# Patient Record
Sex: Female | Born: 1970 | Race: White | Hispanic: No | Marital: Married | Smoking: Never smoker
Health system: Southern US, Community
[De-identification: ages and names within clinical notes are randomized; demographics above are authoritative.]

## PROBLEM LIST (undated history)

## (undated) DIAGNOSIS — F909 Attention-deficit hyperactivity disorder, unspecified type: Secondary | ICD-10-CM

## (undated) DIAGNOSIS — F32A Depression, unspecified: Secondary | ICD-10-CM

## (undated) DIAGNOSIS — F419 Anxiety disorder, unspecified: Secondary | ICD-10-CM

---

## 2011-01-05 ENCOUNTER — Emergency Department (HOSPITAL_COMMUNITY)
Admission: EM | Admit: 2011-01-05 | Discharge: 2011-01-05 | Disposition: A | Discharge status: Home / Self Care | Payer: PRIVATE HEALTH INSURANCE | Attending: Emergency Medicine | Admitting: Emergency Medicine

## 2011-01-05 DIAGNOSIS — Y92009 Unspecified place in unspecified non-institutional (private) residence as the place of occurrence of the external cause: Secondary | ICD-10-CM | POA: Insufficient documentation

## 2011-01-05 DIAGNOSIS — S61209A Unspecified open wound of unspecified finger without damage to nail, initial encounter: Secondary | ICD-10-CM | POA: Insufficient documentation

## 2011-01-05 DIAGNOSIS — Y93G1 Activity, food preparation and clean up: Secondary | ICD-10-CM | POA: Insufficient documentation

## 2011-01-05 DIAGNOSIS — S6990XA Unspecified injury of unspecified wrist, hand and finger(s), initial encounter: Secondary | ICD-10-CM | POA: Insufficient documentation

## 2011-01-05 DIAGNOSIS — M79609 Pain in unspecified limb: Secondary | ICD-10-CM | POA: Insufficient documentation

## 2011-01-05 DIAGNOSIS — S6980XA Other specified injuries of unspecified wrist, hand and finger(s), initial encounter: Secondary | ICD-10-CM | POA: Insufficient documentation

## 2011-01-05 DIAGNOSIS — W260XXA Contact with knife, initial encounter: Secondary | ICD-10-CM | POA: Insufficient documentation

## 2013-05-11 ENCOUNTER — Encounter (HOSPITAL_COMMUNITY): Payer: Self-pay | Admitting: Cardiology

## 2013-05-11 ENCOUNTER — Emergency Department (HOSPITAL_COMMUNITY)
Admission: EM | Admit: 2013-05-11 | Discharge: 2013-05-11 | Disposition: A | Discharge status: Home / Self Care | Payer: PRIVATE HEALTH INSURANCE | Attending: Emergency Medicine | Admitting: Emergency Medicine

## 2013-05-11 ENCOUNTER — Emergency Department (HOSPITAL_COMMUNITY): Payer: PRIVATE HEALTH INSURANCE

## 2013-05-11 DIAGNOSIS — R498 Other voice and resonance disorders: Secondary | ICD-10-CM | POA: Insufficient documentation

## 2013-05-11 DIAGNOSIS — Z79899 Other long term (current) drug therapy: Secondary | ICD-10-CM | POA: Insufficient documentation

## 2013-05-11 DIAGNOSIS — J020 Streptococcal pharyngitis: Secondary | ICD-10-CM | POA: Insufficient documentation

## 2013-05-11 DIAGNOSIS — R1310 Dysphagia, unspecified: Secondary | ICD-10-CM | POA: Insufficient documentation

## 2013-05-11 DIAGNOSIS — K117 Disturbances of salivary secretion: Secondary | ICD-10-CM | POA: Insufficient documentation

## 2013-05-11 DIAGNOSIS — F909 Attention-deficit hyperactivity disorder, unspecified type: Secondary | ICD-10-CM | POA: Insufficient documentation

## 2013-05-11 DIAGNOSIS — H9209 Otalgia, unspecified ear: Secondary | ICD-10-CM | POA: Insufficient documentation

## 2013-05-11 HISTORY — DX: Attention-deficit hyperactivity disorder, unspecified type: F90.9

## 2013-05-11 LAB — CBC WITH DIFFERENTIAL/PLATELET
Basophils Absolute: 0 10*3/uL (ref 0.0–0.1)
Basophils Relative: 0 % (ref 0–1)
Eosinophils Absolute: 0.2 10*3/uL (ref 0.0–0.7)
Eosinophils Relative: 2 % (ref 0–5)
HCT: 38 % (ref 36.0–46.0)
MCHC: 33.4 g/dL (ref 30.0–36.0)
Monocytes Absolute: 0.6 10*3/uL (ref 0.1–1.0)
Neutro Abs: 8 10*3/uL — ABNORMAL HIGH (ref 1.7–7.7)
RDW: 13.8 % (ref 11.5–15.5)

## 2013-05-11 LAB — BASIC METABOLIC PANEL
Calcium: 9.2 mg/dL (ref 8.4–10.5)
Chloride: 102 mEq/L (ref 96–112)
Creatinine, Ser: 0.78 mg/dL (ref 0.50–1.10)
GFR calc Af Amer: >90 mL/min (ref >90)
GFR calc non Af Amer: >90 mL/min (ref >90)

## 2013-05-11 MED ORDER — CLINDAMYCIN HCL 150 MG PO CAPS: 300.0000 mg | ORAL_CAPSULE | Freq: Three times a day (TID) | ORAL | Status: AC

## 2013-05-11 MED ORDER — CLINDAMYCIN PHOSPHATE 600 MG/50ML IV SOLN
600.0000 mg | Freq: Once | INTRAVENOUS | Status: AC
  Administered 2013-05-11: 600 mg via INTRAVENOUS
  Filled 2013-05-11: qty 50

## 2013-05-11 MED ORDER — IOHEXOL 300 MG/ML  SOLN
75.0000 mL | Freq: Once | INTRAMUSCULAR | Status: AC | PRN
  Administered 2013-05-11: 75 mL via INTRAVENOUS

## 2013-05-11 MED ORDER — CLINDAMYCIN PHOSPHATE 300 MG/50ML IV SOLN
300.0000 mg | Freq: Once | INTRAVENOUS | Status: AC
  Administered 2013-05-11: 300 mg via INTRAVENOUS
  Filled 2013-05-11: qty 50

## 2013-05-11 MED ORDER — PREDNISONE 10 MG PO TABS: 60.0000 mg | ORAL_TABLET | Freq: Every day | ORAL | Status: AC

## 2013-05-11 MED ORDER — DEXAMETHASONE SODIUM PHOSPHATE 10 MG/ML IJ SOLN
10.0000 mg | Freq: Once | INTRAMUSCULAR | Status: AC
  Administered 2013-05-11: 10 mg via INTRAVENOUS
  Filled 2013-05-11: qty 1

## 2013-05-11 MED ORDER — KETOROLAC TROMETHAMINE 30 MG/ML IJ SOLN
30.0000 mg | Freq: Once | INTRAMUSCULAR | Status: AC
  Administered 2013-05-11: 30 mg via INTRAVENOUS
  Filled 2013-05-11: qty 1

## 2013-05-11 NOTE — ED Notes (Signed)
Pt reports sore throat that has gotten progessively worse over the past couple of days. Reports increased difficulty swallowing. Airway intact.

## 2013-05-11 NOTE — ED Provider Notes (Signed)
CSN: [id number]     Arrival date & time 05/11/13  0844 History     First MD Initiated Contact with Patient 05/11/13 [phone]     Chief Complaint  Patient presents with  . Sore Throat   (Consider location/radiation/quality/duration/timing/severity/associated sxs/prior Treatment) HPI Pt is a 42yo female presenting with 3 day hx of progressively worsening sore throat, with pain and difficulty swallowing, and noticed change in her voice last night. States symptoms started as URI and reports family members having same over the past week but she is not getting better like they are. States Ibuprofen 800mg  did give moderate relief last night.  Denies fever, n/v/d or headache.     Past Medical History  Diagnosis Date  . ADHD (attention deficit hyperactivity disorder)    Past Surgical History  Procedure Laterality Date  . Cesarean section     History reviewed. No pertinent family history. History  Substance Use Topics  . Smoking status: Never Smoker   . Smokeless tobacco: Not on file  . Alcohol Use: Yes   OB History   Grav Para Term Preterm Abortions TAB SAB Ect Mult Living                 Review of Systems  Constitutional: Negative for fever and chills.  HENT: Positive for ear pain ( bilateral), sore throat, drooling ( mild), trouble swallowing and voice change. Negative for mouth sores, neck pain and neck stiffness.   Respiratory: Negative for shortness of breath and stridor.   Gastrointestinal: Negative for nausea, vomiting, abdominal pain, diarrhea and constipation.  All other systems reviewed and are negative.    Allergies  Tetracyclines & related  Home Medications   Current Outpatient Rx  Name  Route  Sig  Dispense  Refill  . amphetamine-dextroamphetamine (ADDERALL) 10 MG tablet   Oral   Take 20 mg by mouth daily.         Marland Kitchen ibuprofen (ADVIL,MOTRIN) 200 MG tablet   Oral   Take 600 mg by mouth every 6 (six) hours as needed for pain.         Marland Kitchen  levonorgestrel-ethinyl estradiol (AVIANE,ALESSE,LESSINA) 0.1-20 MG-MCG tablet   Oral   Take 1 tablet by mouth daily.         . sertraline (ZOLOFT) 100 MG tablet   Oral   Take 100 mg by mouth daily.         . clindamycin (CLEOCIN) 150 MG capsule   Oral   Take 2 capsules (300 mg total) by mouth 3 (three) times daily. May dispense as 150mg  capsules   60 capsule   0   . predniSONE (DELTASONE) 10 MG tablet   Oral   Take 6 tablets (60 mg total) by mouth daily.   30 tablet   0    BP 108/69  Pulse 81  Temp(Src) 97.5 F (36.4 C) (Oral)  Resp 20  SpO2 98%  LMP 05/11/2013 Physical Exam  Nursing note and vitals reviewed. Constitutional: She appears well-developed and well-nourished.  Pt holding cup, occasionally spitting into cup.  No respiratory distress.  HENT:  Head: Normocephalic and atraumatic. No trismus in the jaw.  Right Ear: Hearing, tympanic membrane, external ear and ear canal normal.  Left Ear: Hearing, tympanic membrane, external ear and ear canal normal.  Nose: Nose normal.  Mouth/Throat: Uvula is midline and mucous membranes are normal. Edematous present. No dental abscesses. Oropharyngeal exudate, posterior oropharyngeal edema, posterior oropharyngeal erythema and tonsillar abscesses ( small, left) present.  Eyes: Conjunctivae are normal. No scleral icterus.  Neck: Normal range of motion.  Cardiovascular: Normal rate, regular rhythm and normal heart sounds.   Pulmonary/Chest: Effort normal and breath sounds normal. No respiratory distress. She has no wheezes. She has no rales. She exhibits no tenderness.  Abdominal: Soft. Bowel sounds are normal. She exhibits no distension and no mass. There is no tenderness. There is no rebound and no guarding.  Musculoskeletal: Normal range of motion.  Neurological: She is alert.  Skin: Skin is warm and dry.    ED Course   Procedures (including critical care time)  Labs Reviewed  CBC WITH DIFFERENTIAL - Abnormal;  Notable for the following:    Neutrophils Relative % 80 (*)    Neutro Abs 8.0 (*)    All other components within normal limits  BASIC METABOLIC PANEL   Ct Soft Tissue Neck W Contrast  05/11/2013   *RADIOLOGY REPORT*  Clinical Data: Sore throat with dysphagia for 3 days.  Chills.  CT NECK WITH CONTRAST  Technique:  Multidetector CT imaging of the neck was performed with intravenous contrast.  Contrast: 75mL OMNIPAQUE IOHEXOL 300 MG/ML  SOLN  Comparison: None.  Findings: Limited intracranial imaging is within normal limits. Normal orbits and globes.  Normal appearance of the nasopharynx.  Soft tissue fullness is identified about the palatine tonsils bilaterally.  Example image 39/series 3.  No evidence of peritonsillar abscess.  Soft tissue fullness extends to just above the level of the epiglottis, including on image 47/series 3.  There is apparent mass effect upon the airway at this level.  The epiglottis and aryepiglottic folds are within normal limits.  Tiny bilateral thyroid nodules are nonspecific  Clear lung apices.  Submandibular and parotid glands enhance symmetrically.  Mildly prominent jugular chain nodes bilaterally.  Largest is in the right level II station and measures 1.6 x  1.5 cm on image 48/series 3.  Favored to be reactive.  All vascular structures enhance normally.  No acute osseous abnormality.  Clear paranasal sinuses and mastoid air cells.   Early spondylosis at C4-C5 with loss of intervertebral disc height.  IMPRESSION: Soft tissue fullness in the region of the palatine tonsils, most consistent with tonsillitis/adenitis.  This causes mass effect upon the airway.  No evidence of abscess.  Prominent jugular chain nodes bilaterally, likely reactive.   Original Report Authenticated By: Jeronimo Greaves, M.D.   1. Strep pharyngitis     MDM  Pt c/o 3 day worsening sore throat, has been occasionally spitting into cup while in ED.  Exam shows tonsillar edema, erythema, with exudates as well as  swelling of uvula.  Discussed pt with Dr. Ranae Palms.  Concern for small paratonsillar abscess. Will get basic labs, CT neck, and start pt on clindamycin, toradol, and decadron.   CT Neck: soft tissue swelling most consistent with tonsillitis/adenitis causing mass effect upon the airway but no evidence of abscess.    Pt states she feels mildly better after medications however still occasionally spitting into cup.  Dr. Ranae Palms consulted Dr. Suszanne Conners, ENT who suggested increased dosage of clindamycin and decadron.    Pt's symptoms have improved since being tx in ED.  No longer having to spit secretions into cup.  States she feels comfortable going home.  Will have pt f/u with Dr. Suszanne Conners next week.  Strict return precautions provided. Pt verbalized understanding and agreement with tx. Plan.   Rx: clindamycin and prednisone.     Junius Finner, PA-C 05/11/13 1621

## 2013-05-12 NOTE — ED Provider Notes (Addendum)
Medical screening examination/treatment/procedure(s) were conducted as a shared visit with non-physician practitioner(s) and myself.  I personally evaluated the patient during the encounter   Loren Racer, MD 05/12/13 1110  Pt with 3 days of sore throat now no longer tolerating secretions. No SOB. CT with tonsillitis and edema extending down OP. Case discussed with Dr Suszanne Conners. Pt with significant improvement with IV abx and decadron. Return precautions given.    Loren Racer, MD 06/02/13 [phone]

## 2015-02-08 IMAGING — CT CT NECK W/ CM
4 of 5 series · 16 of 33 positions shown, 18 images · IV contrast (CONTRAST)
Comparison: None.

CLINICAL DATA: Sore throat with dysphagia for 3 days.  Chills.

CT NECK WITH CONTRAST
TECHNIQUE: Multidetector CT imaging of the neck was performed with
intravenous contrast.
Contrast: 75mL OMNIPAQUE IOHEXOL 300 MG/ML  SOLN

[Series 3: soft tissue · axial · 0.39mm/px · z∈[-183,-43]mm · 4 of 118 slices shown]
[im 24/118  soft-tissue]
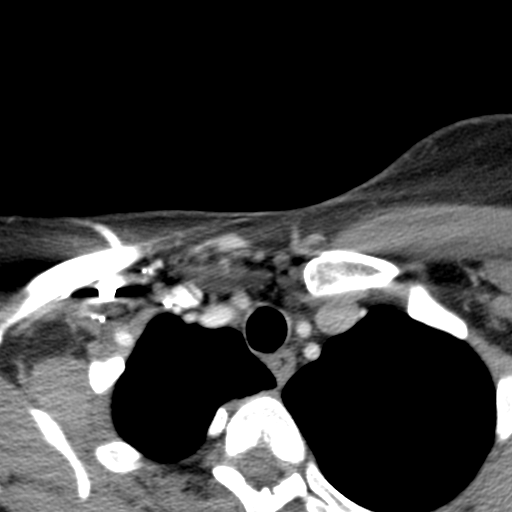
[im 47/118  soft-tissue]
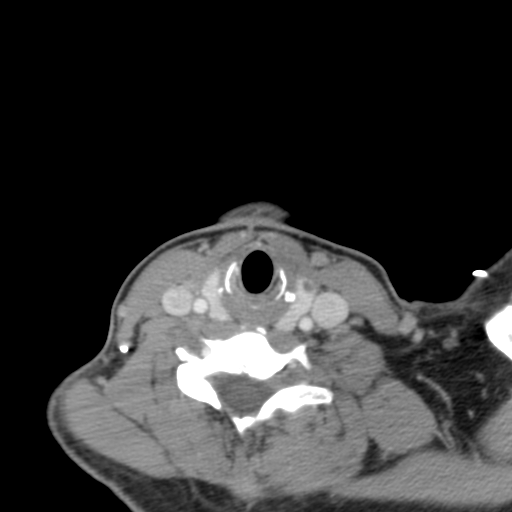
[im 71/118  soft-tissue]
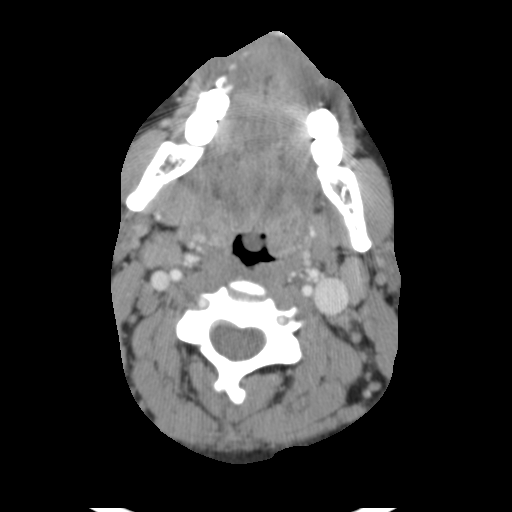
[im 94/118  soft-tissue]
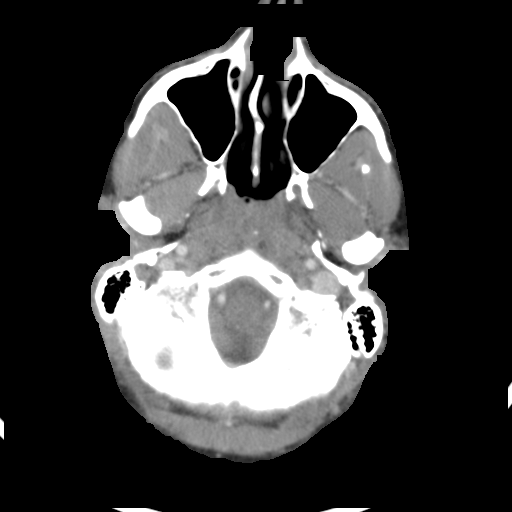

[angle to hyoid. · axial · 0.39mm/px · z∈[-205,-86]mm · 4 of 107 slices shown, 5 images]
[im 22/107  soft-tissue]
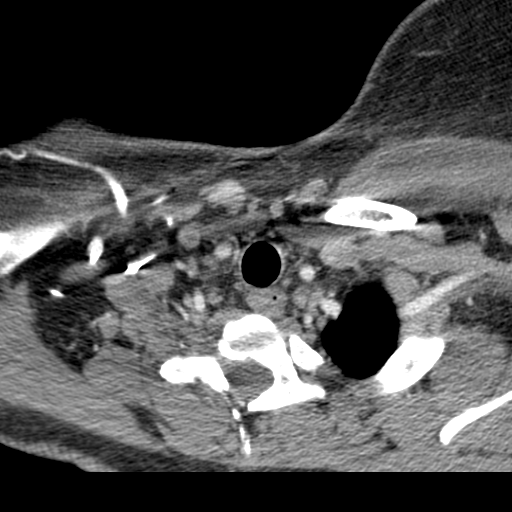
[im 22/107  bone]
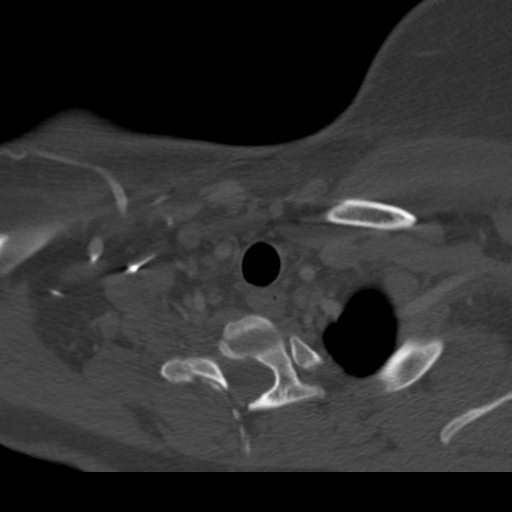
[im 43/107  bone]
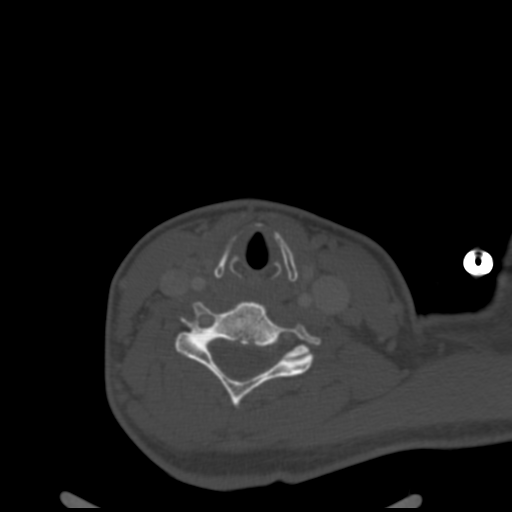
[im 64/107  bone]
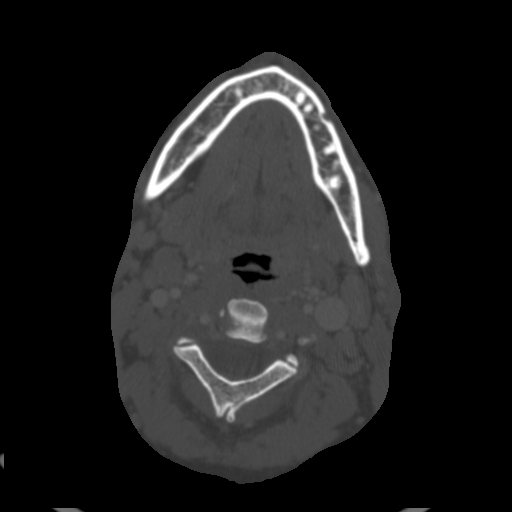
[im 85/107  bone]
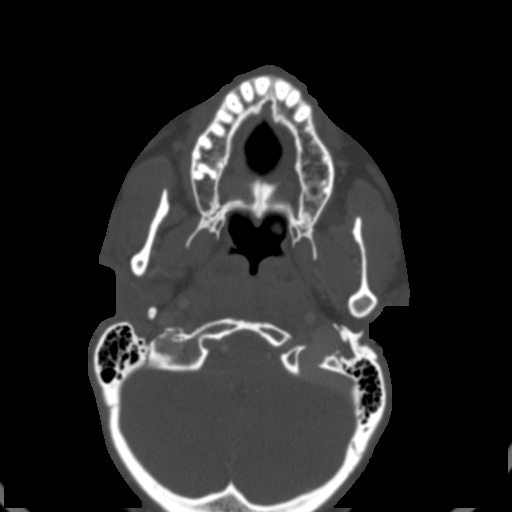

[mpr, sagittal, sagittal · sagittal · 0.46mm/px · 5 of 73 slices shown, 6 images]
[im 25/73  bone]
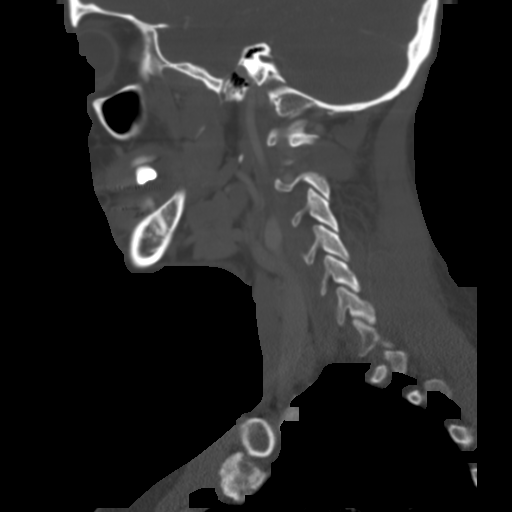
[im 31/73  bone]
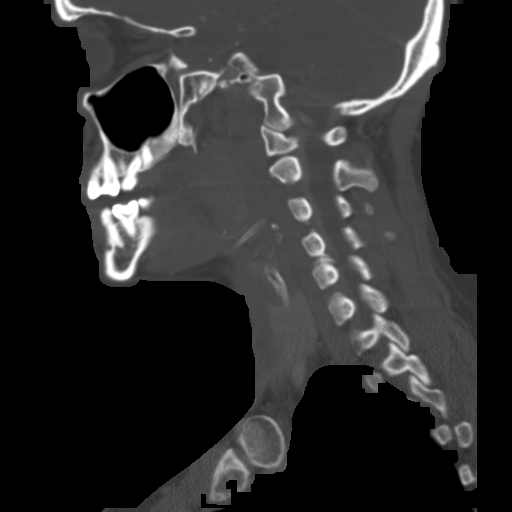
[im 37/73  soft-tissue]
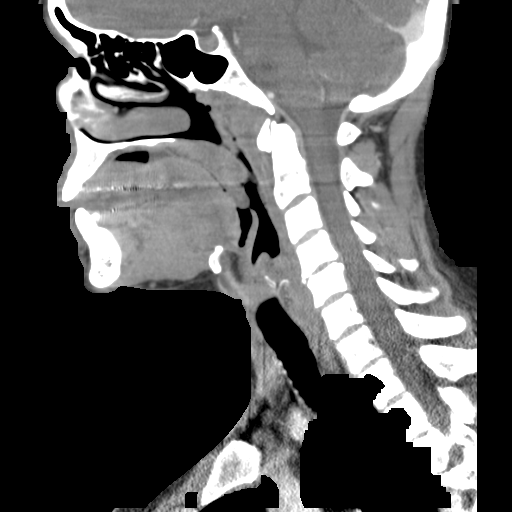
[im 37/73  bone]
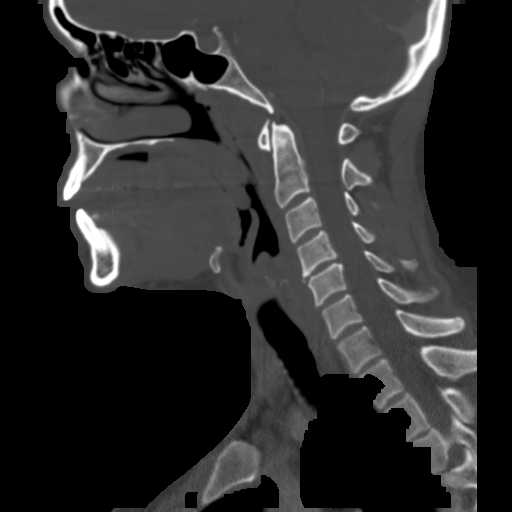
[im 43/73  bone]
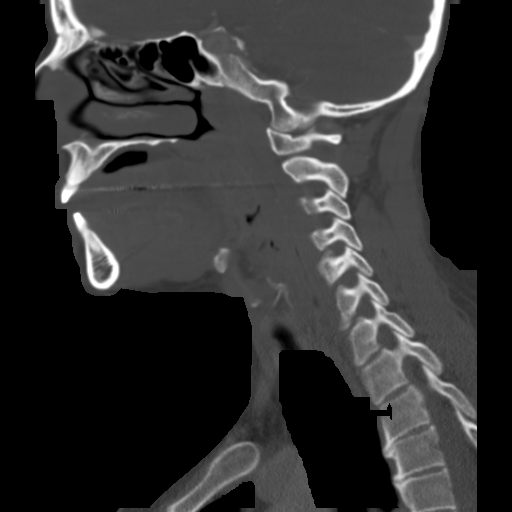
[im 49/73  bone]
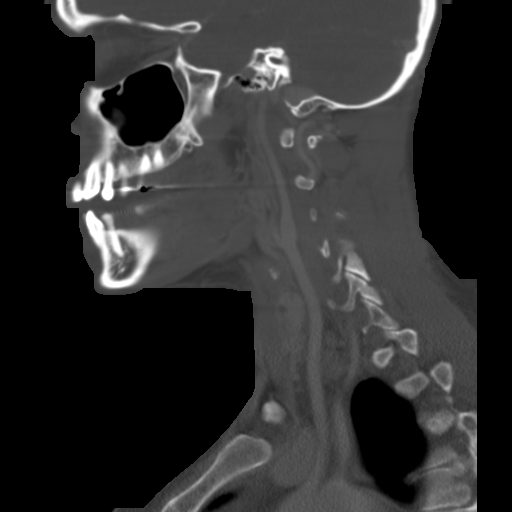

[mpr, coronal, coronal · coronal · 0.46mm/px · 3 of 100 slices shown]
[im 20/100  bone]
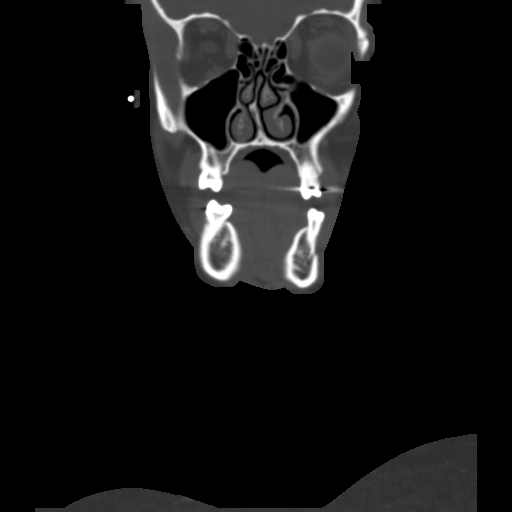
[im 40/100  bone]
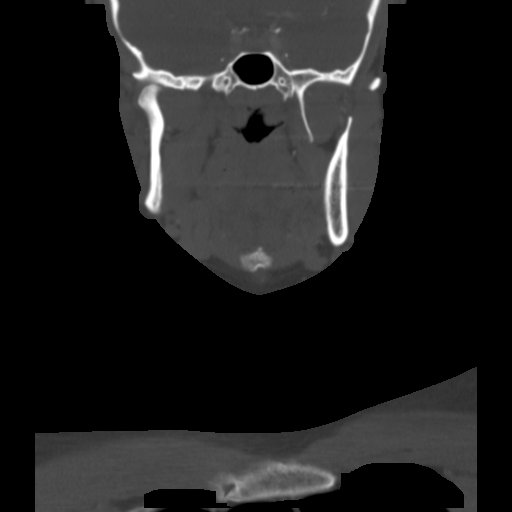
[im 60/100  bone]
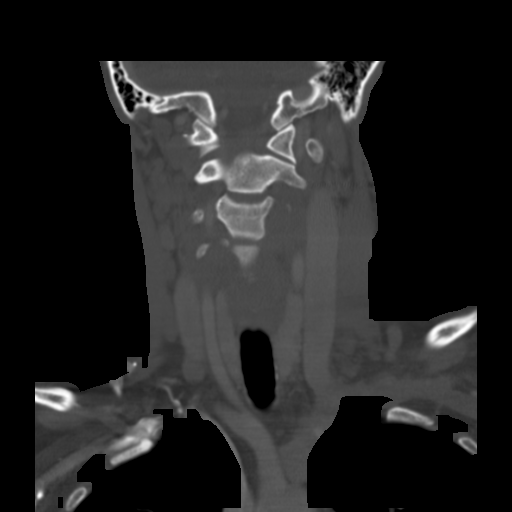

[16 of 33 positions shown; findings below may reference images not displayed]

FINDINGS: Limited intracranial imaging is within normal limits.
Normal orbits and globes.

Normal appearance of the nasopharynx.  Soft tissue fullness is
identified about the palatine tonsils bilaterally.  Example image
39/series 3.  No evidence of peritonsillar abscess.  Soft tissue
fullness extends to just above the level of the epiglottis,
including on image 47/series 3.  There is apparent mass effect upon
the airway at this level.

The epiglottis and aryepiglottic folds are within normal limits.

Tiny bilateral thyroid nodules are nonspecific

Clear lung apices.

Submandibular and parotid glands enhance symmetrically.

Mildly prominent jugular chain nodes bilaterally.  Largest is in
the right level II station and measures 1.6 x  1.5 cm on image
48/series 3.  Favored to be reactive.

All vascular structures enhance normally.

No acute osseous abnormality.  Clear paranasal sinuses and mastoid
air cells.   Early spondylosis at C4-C5 with loss of intervertebral
disc height.
IMPRESSION: Soft tissue fullness in the region of the palatine tonsils, most
consistent with tonsillitis/adenitis.  This causes mass effect upon
the airway.  No evidence of abscess.

Prominent jugular chain nodes bilaterally, likely reactive.

## 2021-08-23 ENCOUNTER — Encounter (HOSPITAL_BASED_OUTPATIENT_CLINIC_OR_DEPARTMENT_OTHER): Payer: Self-pay | Admitting: Obstetrics and Gynecology

## 2021-08-23 ENCOUNTER — Emergency Department (HOSPITAL_BASED_OUTPATIENT_CLINIC_OR_DEPARTMENT_OTHER)
Admission: EM | Admit: 2021-08-23 | Discharge: 2021-08-23 | Disposition: A | Discharge status: Home / Self Care | Payer: PRIVATE HEALTH INSURANCE | Attending: Emergency Medicine | Admitting: Emergency Medicine

## 2021-08-23 ENCOUNTER — Emergency Department (HOSPITAL_BASED_OUTPATIENT_CLINIC_OR_DEPARTMENT_OTHER): Payer: PRIVATE HEALTH INSURANCE | Admitting: Radiology

## 2021-08-23 DIAGNOSIS — Z20822 Contact with and (suspected) exposure to covid-19: Secondary | ICD-10-CM | POA: Diagnosis not present

## 2021-08-23 DIAGNOSIS — R7309 Other abnormal glucose: Secondary | ICD-10-CM | POA: Diagnosis not present

## 2021-08-23 DIAGNOSIS — R0789 Other chest pain: Secondary | ICD-10-CM | POA: Diagnosis present

## 2021-08-23 DIAGNOSIS — R079 Chest pain, unspecified: Secondary | ICD-10-CM

## 2021-08-23 HISTORY — DX: Depression, unspecified: F32.A

## 2021-08-23 HISTORY — DX: Anxiety disorder, unspecified: F41.9

## 2021-08-23 LAB — CBC
HCT: 35.5 % — ABNORMAL LOW (ref 36.0–46.0)
Hemoglobin: 11.2 g/dL — ABNORMAL LOW (ref 12.0–15.0)
MCH: 25 pg — ABNORMAL LOW (ref 26.0–34.0)
MCHC: 31.5 g/dL (ref 30.0–36.0)
MCV: 79.2 fL — ABNORMAL LOW (ref 80.0–100.0)
Platelets: 567 10*3/uL — ABNORMAL HIGH (ref 150–400)
RBC: 4.48 MIL/uL (ref 3.87–5.11)
RDW: 15.8 % — ABNORMAL HIGH (ref 11.5–15.5)
WBC: 7.6 10*3/uL (ref 4.0–10.5)
nRBC: 0 % (ref 0.0–0.2)

## 2021-08-23 LAB — BASIC METABOLIC PANEL
Anion gap: 9 (ref 5–15)
BUN: 16 mg/dL (ref 6–20)
CO2: 24 mmol/L (ref 22–32)
Calcium: 9.1 mg/dL (ref 8.9–10.3)
Chloride: 105 mmol/L (ref 98–111)
Creatinine, Ser: 0.83 mg/dL (ref 0.44–1.00)
GFR, Estimated: >60 mL/min (ref >60)
Glucose, Bld: 83 mg/dL (ref 70–99)
Potassium: 3.2 mmol/L — ABNORMAL LOW (ref 3.5–5.1)
Sodium: 138 mmol/L (ref 135–145)

## 2021-08-23 LAB — CBG MONITORING, ED: Glucose-Capillary: 96 mg/dL (ref 70–99)

## 2021-08-23 LAB — RESP PANEL BY RT-PCR (FLU A&B, COVID) ARPGX2
Influenza A by PCR: NEGATIVE
Influenza B by PCR: NEGATIVE
SARS Coronavirus 2 by RT PCR: NEGATIVE

## 2021-08-23 LAB — TROPONIN I (HIGH SENSITIVITY)
Troponin I (High Sensitivity): <2 ng/L (ref <18)
Troponin I (High Sensitivity): <2 ng/L (ref <18)

## 2021-08-23 LAB — D-DIMER, QUANTITATIVE: D-Dimer, Quant: 0.33 ug/mL-FEU (ref 0.00–0.50)

## 2021-08-23 MED ORDER — ALUM & MAG HYDROXIDE-SIMETH 200-200-20 MG/5ML PO SUSP
30.0000 mL | Freq: Once | ORAL | Status: AC
  Administered 2021-08-23: 30 mL via ORAL
  Filled 2021-08-23: qty 30

## 2021-08-23 MED ORDER — ACETAMINOPHEN 500 MG PO TABS
1000.0000 mg | ORAL_TABLET | Freq: Once | ORAL | Status: AC
  Administered 2021-08-23: 1000 mg via ORAL
  Filled 2021-08-23: qty 2

## 2021-08-23 MED ORDER — IBUPROFEN 400 MG PO TABS
600.0000 mg | ORAL_TABLET | Freq: Once | ORAL | Status: AC
  Administered 2021-08-23: 600 mg via ORAL
  Filled 2021-08-23: qty 1

## 2021-08-23 NOTE — ED Triage Notes (Signed)
Patient reports to the ER for chest pain that radiates to back between the scapula. Patient reports she has vitamin D deficiency, Anxiety and depression, denies any cardiac hx.

## 2021-08-23 NOTE — ED Provider Notes (Signed)
[facility] EMERGENCY DEPT Provider Note   CSN: [id number] Arrival date & time: 08/23/21  1734     History Chief Complaint  Patient presents with   Chest Pain    Jasmin Robertson is a 50 y.o. female.  Pt presents to the ED today with cp.  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  She has developed chest pain that radiates to her back.  She said it has been going on for 2 days.  She has tried tums and pepcid without improvement in sx.  She said exertion makes it better.  She denies any sob.  No n/v. No fevers.      Past Medical History:  Diagnosis Date   ADHD (attention deficit hyperactivity disorder)    Anxiety    Depression     There are no problems to display for this patient.   Past Surgical History:  Procedure Laterality Date   CESAREAN SECTION       OB History     Gravida      Para      Term      Preterm      AB      Living  2      SAB      IAB      Ectopic      Multiple      Live Births  2           No family history on file.  Social History   Tobacco Use   Smoking status: Never    Passive exposure: Never   Smokeless tobacco: Never  Vaping Use   Vaping Use: Never used  Substance Use Topics   Alcohol use: Yes   Drug use: No    Home Medications Prior to Admission medications   Medication Sig Start Date End Date Taking? Authorizing Provider  amphetamine-dextroamphetamine (ADDERALL) 10 MG tablet Take 20 mg by mouth daily.    [provider]  clindamycin (CLEOCIN) 150 MG capsule Take 2 capsules (300 mg total) by mouth 3 (three) times daily. May dispense as 150mg  capsules 05/11/13   05/13/13, PA-C  ibuprofen (ADVIL,MOTRIN) 200 MG tablet Take 600 mg by mouth every 6 (six) hours as needed for pain.    [provider]  levonorgestrel-ethinyl estradiol (AVIANE,ALESSE,LESSINA) 0.1-20 MG-MCG tablet Take 1 tablet by mouth daily.    [provider]  predniSONE (DELTASONE) 10 MG tablet Take 6 tablets  (60 mg total) by mouth daily. 05/11/13   05/13/13, PA-C  sertraline (ZOLOFT) 100 MG tablet Take 100 mg by mouth daily.    [provider]    Allergies    Tetracyclines & related  Review of Systems   Review of Systems  Cardiovascular:  Positive for chest pain.  All other systems reviewed and are negative.  Physical Exam Updated Vital Signs BP 132/77 (BP Location: Right Arm)   Pulse 86   Temp 98.2 F (36.8 C)   Resp 16   Ht 5\' 3"  (1.6 m)   Wt 73.9 kg   SpO2 100%   BMI 28.87 kg/m   Physical Exam Vitals and nursing note reviewed.  Constitutional:      Appearance: She is well-developed.  HENT:     Head: Normocephalic and atraumatic.  Eyes:     Extraocular Movements: Extraocular movements intact.     Pupils: Pupils are equal, round, and reactive to light.  Cardiovascular:     Rate and Rhythm: Normal  rate and regular rhythm.     Heart sounds: Normal heart sounds.  Pulmonary:     Effort: Pulmonary effort is normal.     Breath sounds: Normal breath sounds.  Abdominal:     General: Bowel sounds are normal.     Palpations: Abdomen is soft.  Musculoskeletal:        General: Normal range of motion.     Cervical back: Normal range of motion and neck supple.  Skin:    General: Skin is warm.     Capillary Refill: Capillary refill takes less than 2 seconds.  Neurological:     Mental Status: She is alert.  Psychiatric:        Mood and Affect: Mood normal.        Behavior: Behavior normal.    ED Results / Procedures / Treatments   Labs (all labs ordered are listed, but only abnormal results are displayed) Labs Reviewed  BASIC METABOLIC PANEL - Abnormal; Notable for the following components:      Result Value   Potassium 3.2 (*)    All other components within normal limits  CBC - Abnormal; Notable for the following components:   Hemoglobin 11.2 (*)    HCT 35.5 (*)    MCV 79.2 (*)    MCH 25.0 (*)    RDW 15.8 (*)    Platelets 567 (*)    All other  components within normal limits  RESP PANEL BY RT-PCR (FLU A&B, COVID) ARPGX2  D-DIMER, QUANTITATIVE  PREGNANCY, URINE  CBG MONITORING, ED  TROPONIN I (HIGH SENSITIVITY)  TROPONIN I (HIGH SENSITIVITY)    EKG EKG Interpretation  Date/Time:  Sunday August 23 2021 17:41:25 EST Ventricular Rate:  90 PR Interval:  154 QRS Duration: 72 QT Interval:  340 QTC Calculation: 415 R Axis:   64 Text Interpretation: Normal sinus rhythm Normal ECG No old tracing to compare Confirmed by Jacalyn Lefevre [phone]) on 08/23/2021 7:54:47 PM  Radiology DG Chest Port 1 View  Result Date: 08/23/2021 CLINICAL DATA:  Chest pain EXAM: PORTABLE CHEST 1 VIEW COMPARISON:  None. FINDINGS: The heart size and mediastinal contours are within normal limits. Both lungs are clear. The visualized skeletal structures are unremarkable. IMPRESSION: No active disease. Electronically Signed   By: Alcide Clever M.D.   On: 08/23/2021 21:24    Procedures Procedures   Medications Ordered in ED Medications  alum & mag hydroxide-simeth (MAALOX/MYLANTA) 200-200-20 MG/5ML suspension 30 mL (30 mLs Oral Given 08/23/21 2031)  acetaminophen (TYLENOL) tablet 1,000 mg (1,000 mg Oral Given 08/23/21 2138)  ibuprofen (ADVIL) tablet 600 mg (600 mg Oral Given 08/23/21 2138)    ED Course  I have reviewed the triage vital signs and the nursing notes.  Pertinent labs & imaging results that were available during my care of the patient were reviewed by me and considered in my medical decision making (see chart for details).    MDM Rules/Calculators/A&P                           Pt's cardiac work up neg.  Ddimer neg.  Heart score 1.  Gi cocktail did not do much for the pain.  Pt wants to hold off on the chest ct scan for now and her ddimer is negative.  She will return if worse.  She is to f/u with pcp.  Return if worse. Final Clinical Impression(s) / ED Diagnoses Final diagnoses:  Atypical chest pain  Rx / DC Orders ED Discharge  Orders     None        Jacalyn Lefevre, MD 08/23/21 2147

## 2023-05-23 IMAGING — DX DG CHEST 1V PORT
1 series · 1 of 1 positions shown · non-contrast
Comparison: None.

CLINICAL DATA: Chest pain

EXAM:
PORTABLE CHEST 1 VIEW

[chest]
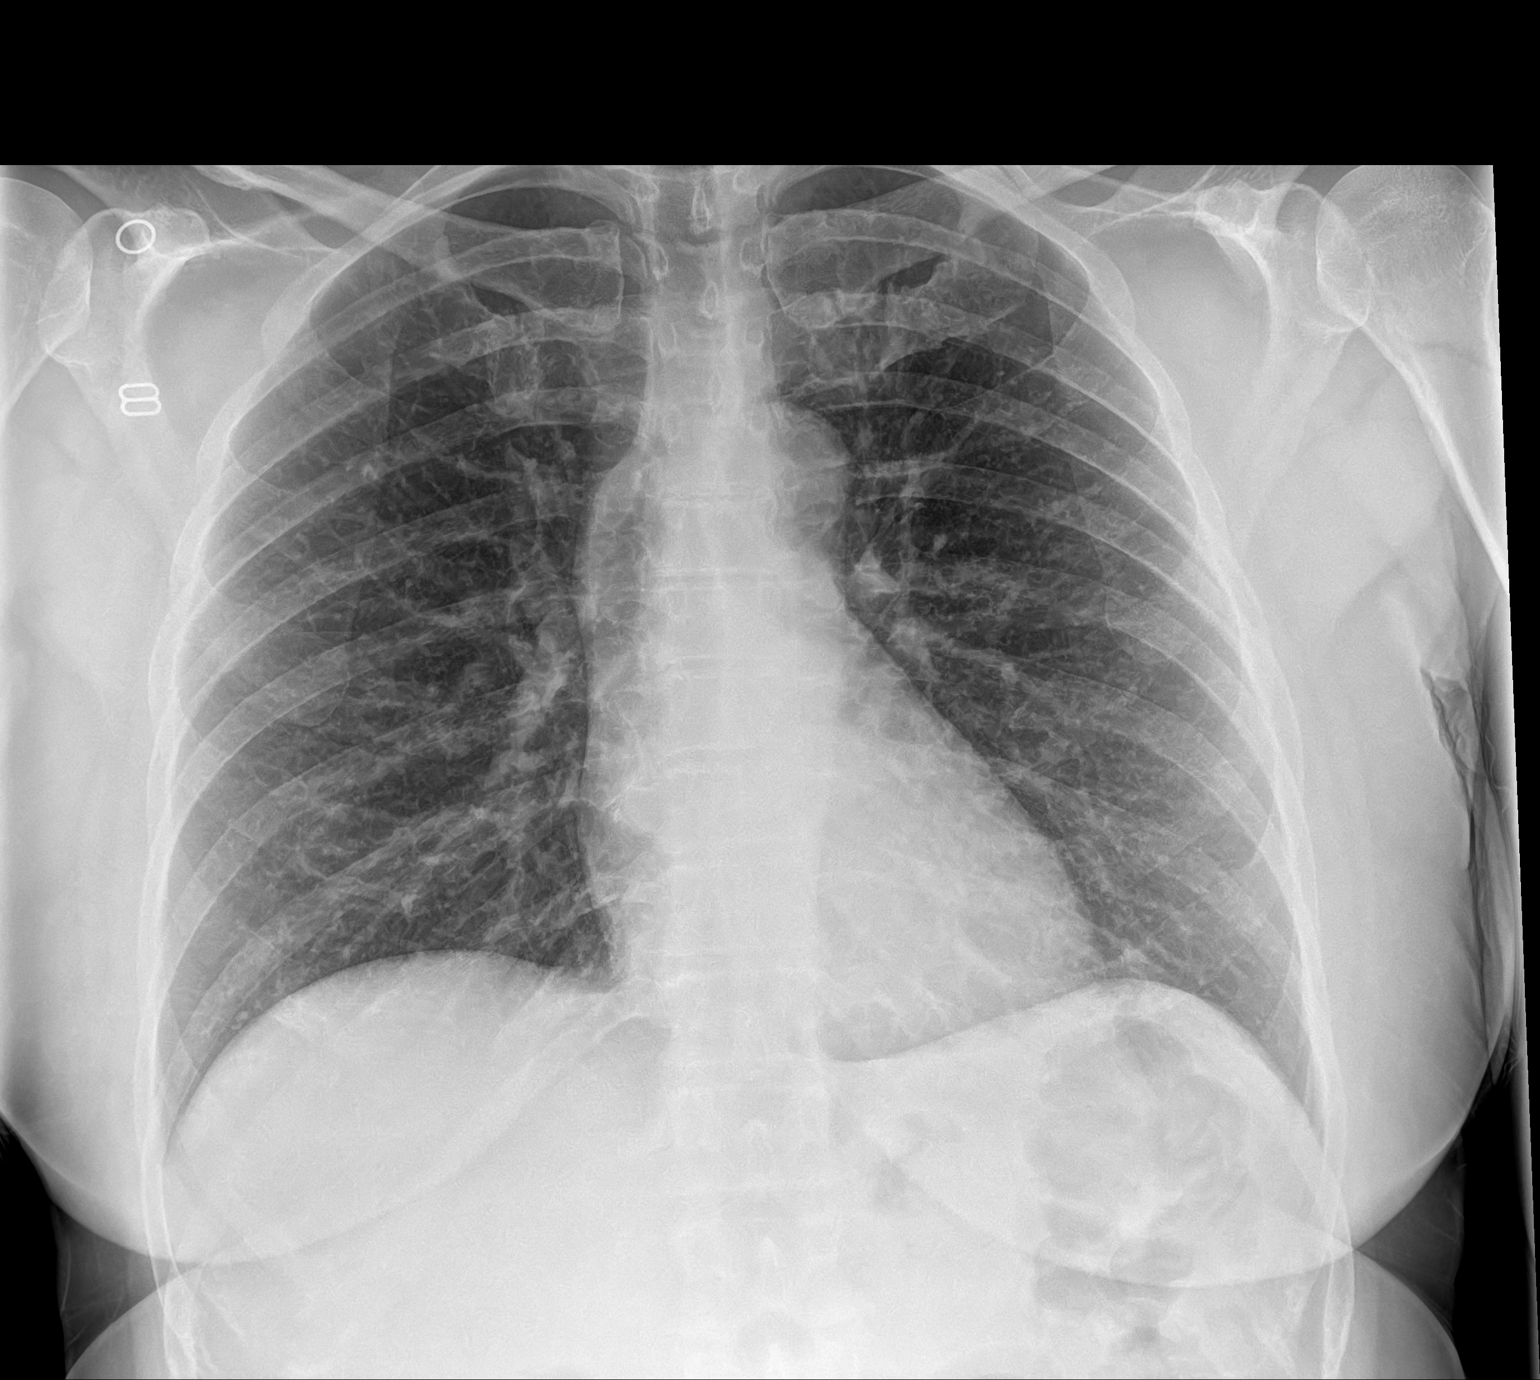

[1 of 1 positions shown; findings below may reference images not displayed]

FINDINGS: The heart size and mediastinal contours are within normal limits.
Both lungs are clear. The visualized skeletal structures are
unremarkable.
IMPRESSION: No active disease.
